# Patient Record
Sex: Male | Born: 1995 | Race: Black or African American | Hispanic: No | Smoking: Current every day smoker
Health system: Southern US, Community
[De-identification: ages and names within clinical notes are randomized; demographics above are authoritative.]

## PROBLEM LIST (undated history)

## (undated) HISTORY — PX: HERNIA REPAIR: SHX51

---

## 2015-05-29 ENCOUNTER — Emergency Department (HOSPITAL_COMMUNITY)
Admission: EM | Admit: 2015-05-29 | Discharge: 2015-05-29 | Disposition: A | Discharge status: Home / Self Care | Payer: No Typology Code available for payment source | Attending: Emergency Medicine | Admitting: Emergency Medicine

## 2015-05-29 ENCOUNTER — Emergency Department (HOSPITAL_COMMUNITY): Payer: No Typology Code available for payment source

## 2015-05-29 ENCOUNTER — Encounter (HOSPITAL_COMMUNITY): Payer: Self-pay | Admitting: *Deleted

## 2015-05-29 DIAGNOSIS — S0990XA Unspecified injury of head, initial encounter: Secondary | ICD-10-CM | POA: Diagnosis present

## 2015-05-29 DIAGNOSIS — F17200 Nicotine dependence, unspecified, uncomplicated: Secondary | ICD-10-CM | POA: Insufficient documentation

## 2015-05-29 DIAGNOSIS — Y998 Other external cause status: Secondary | ICD-10-CM | POA: Insufficient documentation

## 2015-05-29 DIAGNOSIS — Z8719 Personal history of other diseases of the digestive system: Secondary | ICD-10-CM | POA: Insufficient documentation

## 2015-05-29 DIAGNOSIS — M25521 Pain in right elbow: Secondary | ICD-10-CM

## 2015-05-29 DIAGNOSIS — Y92410 Unspecified street and highway as the place of occurrence of the external cause: Secondary | ICD-10-CM | POA: Diagnosis not present

## 2015-05-29 DIAGNOSIS — R519 Headache, unspecified: Secondary | ICD-10-CM

## 2015-05-29 DIAGNOSIS — Y9389 Activity, other specified: Secondary | ICD-10-CM | POA: Diagnosis not present

## 2015-05-29 DIAGNOSIS — S59901A Unspecified injury of right elbow, initial encounter: Secondary | ICD-10-CM | POA: Insufficient documentation

## 2015-05-29 DIAGNOSIS — R51 Headache: Secondary | ICD-10-CM

## 2015-05-29 MED ORDER — ACETAMINOPHEN 325 MG PO TABS
650.0000 mg | ORAL_TABLET | Freq: Once | ORAL | Status: AC
  Administered 2015-05-29: 650 mg via ORAL
  Filled 2015-05-29: qty 2

## 2015-05-29 MED ORDER — NAPROXEN 250 MG PO TABS: 250.0000 mg | ORAL_TABLET | Freq: Two times a day (BID) | ORAL | Status: AC

## 2015-05-29 MED ORDER — METHOCARBAMOL 500 MG PO TABS: 500.0000 mg | ORAL_TABLET | Freq: Two times a day (BID) | ORAL | Status: AC | PRN
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
## 2015-05-29 NOTE — ED Notes (Signed)
C/o a headache 

## 2015-05-29 NOTE — ED Notes (Signed)
To x-ray
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
## 2015-06-12 ENCOUNTER — Encounter (HOSPITAL_COMMUNITY): Payer: Self-pay | Admitting: Emergency Medicine

## 2015-06-12 ENCOUNTER — Emergency Department (HOSPITAL_COMMUNITY)
Admission: EM | Admit: 2015-06-12 | Discharge: 2015-06-13 | Disposition: A | Discharge status: Home / Self Care | Payer: No Typology Code available for payment source | Attending: Emergency Medicine | Admitting: Emergency Medicine

## 2015-06-12 DIAGNOSIS — Y998 Other external cause status: Secondary | ICD-10-CM | POA: Insufficient documentation

## 2015-06-12 DIAGNOSIS — R51 Headache: Secondary | ICD-10-CM

## 2015-06-12 DIAGNOSIS — Y9389 Activity, other specified: Secondary | ICD-10-CM | POA: Insufficient documentation

## 2015-06-12 DIAGNOSIS — Y92410 Unspecified street and highway as the place of occurrence of the external cause: Secondary | ICD-10-CM | POA: Diagnosis not present

## 2015-06-12 DIAGNOSIS — H53149 Visual discomfort, unspecified: Secondary | ICD-10-CM | POA: Diagnosis not present

## 2015-06-12 DIAGNOSIS — R519 Headache, unspecified: Secondary | ICD-10-CM

## 2015-06-12 DIAGNOSIS — F17200 Nicotine dependence, unspecified, uncomplicated: Secondary | ICD-10-CM | POA: Insufficient documentation

## 2015-06-12 DIAGNOSIS — Z8719 Personal history of other diseases of the digestive system: Secondary | ICD-10-CM | POA: Diagnosis not present

## 2015-06-12 DIAGNOSIS — S0990XA Unspecified injury of head, initial encounter: Secondary | ICD-10-CM | POA: Insufficient documentation

## 2015-06-12 NOTE — ED Notes (Signed)
Bed: WA26 Expected date:  Expected time:  Means of arrival:  Comments: 
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
## 2015-06-12 NOTE — ED Notes (Signed)
Patient is looking at cellphone while being accessed without difficultly.

## 2015-06-13 MED ORDER — IBUPROFEN 600 MG PO TABS: 600.0000 mg | ORAL_TABLET | Freq: Four times a day (QID) | ORAL | Status: AC | PRN

## 2015-06-13 MED ORDER — KETOROLAC TROMETHAMINE 15 MG/ML IJ SOLN
15.0000 mg | Freq: Once | INTRAMUSCULAR | Status: AC
  Administered 2015-06-13: 15 mg via INTRAMUSCULAR
  Filled 2015-06-13: qty 1

## 2015-06-13 MED ORDER — METOCLOPRAMIDE HCL 10 MG PO TABS
10.0000 mg | ORAL_TABLET | Freq: Once | ORAL | Status: AC
  Administered 2015-06-13: 10 mg via ORAL
  Filled 2015-06-13: qty 1
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
## 2015-06-13 NOTE — ED Notes (Signed)
Patient d/c'd self care.  F/U and medication discussed.  Patient verbalized understanding. 

## 2015-06-13 NOTE — Discharge Instructions (Signed)
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
